# Patient Record
Sex: Female | Born: 1975 | Race: White | Hispanic: No | Marital: Married | Smoking: Never smoker
Health system: Southern US, Community
[De-identification: ages and names within clinical notes are randomized; demographics above are authoritative.]

## PROBLEM LIST (undated history)

## (undated) HISTORY — PX: CHOLECYSTECTOMY: SHX55

## (undated) HISTORY — PX: OTHER SURGICAL HISTORY: SHX169

---

## 2021-05-27 ENCOUNTER — Encounter: Payer: Self-pay | Admitting: Emergency Medicine

## 2021-05-27 ENCOUNTER — Emergency Department: Payer: Managed Care, Other (non HMO)

## 2021-05-27 ENCOUNTER — Emergency Department
Admission: EM | Admit: 2021-05-27 | Discharge: 2021-05-27 | Disposition: A | Discharge status: Home / Self Care | Payer: Managed Care, Other (non HMO) | Attending: Emergency Medicine | Admitting: Emergency Medicine

## 2021-05-27 DIAGNOSIS — R102 Pelvic and perineal pain: Secondary | ICD-10-CM | POA: Diagnosis not present

## 2021-05-27 DIAGNOSIS — M25562 Pain in left knee: Secondary | ICD-10-CM | POA: Insufficient documentation

## 2021-05-27 LAB — CBC
HCT: 41.5 % (ref 36.0–46.0)
Hemoglobin: 14.1 g/dL (ref 12.0–15.0)
MCH: 28.1 pg (ref 26.0–34.0)
MCHC: 34 g/dL (ref 30.0–36.0)
MCV: 82.8 fL (ref 80.0–100.0)
Platelets: 202 10*3/uL (ref 150–400)
RBC: 5.01 MIL/uL (ref 3.87–5.11)
RDW: 14.2 % (ref 11.5–15.5)
WBC: 6.6 10*3/uL (ref 4.0–10.5)
nRBC: 0 % (ref 0.0–0.2)

## 2021-05-27 LAB — URINALYSIS, COMPLETE (UACMP) WITH MICROSCOPIC
Bacteria, UA: NONE SEEN
Bilirubin Urine: NEGATIVE
Glucose, UA: NEGATIVE mg/dL
Ketones, ur: NEGATIVE mg/dL
Leukocytes,Ua: NEGATIVE
Nitrite: NEGATIVE
Protein, ur: NEGATIVE mg/dL
Specific Gravity, Urine: 1.021 (ref 1.005–1.030)
pH: 5 (ref 5.0–8.0)

## 2021-05-27 LAB — COMPREHENSIVE METABOLIC PANEL
ALT: 22 U/L (ref 0–44)
AST: 23 U/L (ref 15–41)
Albumin: 3.8 g/dL (ref 3.5–5.0)
Alkaline Phosphatase: 56 U/L (ref 38–126)
Anion gap: 10 (ref 5–15)
BUN: 15 mg/dL (ref 6–20)
CO2: 24 mmol/L (ref 22–32)
Calcium: 9.1 mg/dL (ref 8.9–10.3)
Chloride: 101 mmol/L (ref 98–111)
Creatinine, Ser: 0.69 mg/dL (ref 0.44–1.00)
GFR, Estimated: >60 mL/min (ref >60)
Glucose, Bld: 120 mg/dL — ABNORMAL HIGH (ref 70–99)
Potassium: 3.7 mmol/L (ref 3.5–5.1)
Sodium: 135 mmol/L (ref 135–145)
Total Bilirubin: 1.2 mg/dL (ref 0.3–1.2)
Total Protein: 7.1 g/dL (ref 6.5–8.1)

## 2021-05-27 LAB — LIPASE, BLOOD: Lipase: 32 U/L (ref 11–51)

## 2021-05-27 LAB — POC URINE PREG, ED: Preg Test, Ur: NEGATIVE

## 2021-05-27 MED ORDER — DICYCLOMINE HCL 10 MG PO CAPS
10.0000 mg | ORAL_CAPSULE | Freq: Once | ORAL | Status: AC
  Administered 2021-05-27: 10 mg via ORAL
  Filled 2021-05-27: qty 1

## 2021-05-27 MED ORDER — IOHEXOL 350 MG/ML SOLN
100.0000 mL | Freq: Once | INTRAVENOUS | Status: AC | PRN
  Administered 2021-05-27: 100 mL via INTRAVENOUS
  Filled 2021-05-27: qty 100

## 2021-05-27 MED ORDER — MORPHINE SULFATE (PF) 4 MG/ML IV SOLN
4.0000 mg | Freq: Once | INTRAVENOUS | Status: AC
  Administered 2021-05-27: 4 mg via INTRAVENOUS
  Filled 2021-05-27: qty 1

## 2021-05-27 MED ORDER — ONDANSETRON HCL 4 MG/2ML IJ SOLN
4.0000 mg | Freq: Once | INTRAMUSCULAR | Status: AC
  Administered 2021-05-27: 4 mg via INTRAVENOUS
  Filled 2021-05-27: qty 2

## 2021-05-27 MED ORDER — OXYCODONE-ACETAMINOPHEN 5-325 MG PO TABS: 1.0000 | ORAL_TABLET | ORAL | 0 refills | Status: AC | PRN

## 2021-05-27 MED ORDER — HYDROMORPHONE HCL 1 MG/ML IJ SOLN
1.0000 mg | Freq: Once | INTRAMUSCULAR | Status: AC
  Administered 2021-05-27: 1 mg via INTRAVENOUS
  Filled 2021-05-27: qty 1

## 2021-05-27 NOTE — ED Notes (Signed)
Pt currently in ultrasound

## 2021-05-27 NOTE — ED Notes (Signed)
Patient provided discharge instructions, including follow up care, ortho follow up, medications to be picked up, and use of knee immobilizer. Patient verbalized understanding. Patient denies questions or concerns. Patient ambulatory with knee immobilizer at discharge.

## 2021-05-27 NOTE — ED Provider Notes (Signed)
[facility] Emergency Department Provider Note  ____________________________________________   Event Date/Time   First MD Initiated Contact with Patient 05/27/21 1222     (approximate)  I have reviewed the triage vital signs and the nursing notes.   HISTORY  Chief Complaint Abdominal Pain    HPI Gloria Summers is a 45 y.o. female presents emergency department with lower abdominal pain.  Patient states she been very bloated and very painful for the last 2 hours.  She denies any vomiting or diarrhea.  Patient does still have an appendix.  History of ovarian cyst.  She denies any fever or chills.  No vaginal discharge.  Patient also has left knee pain in which she was headed [facility] but had to cancel if she is here.  History reviewed. No pertinent past medical history.  There are no problems to display for this patient.     Prior to Admission medications   Medication Sig Start Date End Date Taking? Authorizing Provider  oxyCODONE-acetaminophen (PERCOCET) 5-325 MG tablet Take 1 tablet by mouth every 4 (four) hours as needed for severe pain. 05/27/21 05/27/22 Yes Sheena Donegan, Roselyn Bering, PA-C    Allergies Demerol [meperidine hcl], Doxycycline, and Penicillins  No family history on file.  Social History    Review of Systems  Constitutional: No fever/chills Eyes: No visual changes. ENT: No sore throat. Respiratory: Denies cough Cardiovascular: Denies chest pain Gastrointestinal: Positive abdominal pain Genitourinary: Negative for dysuria. Musculoskeletal: Negative for back pain. Skin: Negative for rash. Psychiatric: no mood changes,     ____________________________________________   PHYSICAL EXAM:  VITAL SIGNS: ED Triage Vitals  Enc Vitals Group     BP 05/27/21 1019 131/88     Pulse Rate 05/27/21 1019 78     Resp 05/27/21 1019 18     Temp 05/27/21 1019 97.9 F (36.6 C)     Temp src --      SpO2 05/27/21 1019 99  %     Weight 05/27/21 1003 200 lb (90.7 kg)     Height 05/27/21 1003 5\' 5"  (1.651 m)     Head Circumference --      Peak Flow --      Pain Score 05/27/21 1003 8     Pain Loc --      Pain Edu? --      Excl. in GC? --     Constitutional: Alert and oriented. Well appearing and in no acute distress. Eyes: Conjunctivae are normal.  Head: Atraumatic. Nose: No congestion/rhinnorhea. Mouth/Throat: Mucous membranes are moist.   Neck:  supple no lymphadenopathy noted Cardiovascular: Normal rate, regular rhythm. Heart sounds are normal Respiratory: Normal respiratory effort.  No retractions, lungs c t a  Abd: soft tender in the lower abdomen bilaterally bs normal all 4 quad GU: deferred Musculoskeletal: FROM all extremities, warm and well perfused, left knee has a large amount of swelling noted tender at the joint line, neurovascular intact Neurologic:  Normal speech and language.  Skin:  Skin is warm, dry and intact. No rash noted. Psychiatric: Mood and affect are normal. Speech and behavior are normal.  ____________________________________________   LABS (all labs ordered are listed, but only abnormal results are displayed)  Labs Reviewed  COMPREHENSIVE METABOLIC PANEL - Abnormal; Notable for the following components:      Result Value   Glucose, Bld 120 (*)    All other components within normal limits  URINALYSIS, COMPLETE (UACMP) WITH MICROSCOPIC - Abnormal; Notable for  the following components:   Color, Urine YELLOW (*)    APPearance HAZY (*)    Hgb urine dipstick SMALL (*)    All other components within normal limits  LIPASE, BLOOD  CBC  POC URINE PREG, ED   ____________________________________________   ____________________________________________  RADIOLOGY  X-ray of the left knee, CT abdomen/pelvis with contrast, ultrasound of the pelvis  ____________________________________________   PROCEDURES  Procedure(s) performed:  No  Procedures    ____________________________________________   INITIAL IMPRESSION / ASSESSMENT AND PLAN / ED COURSE  Pertinent labs & imaging results that were available during my care of the patient were reviewed by me and considered in my medical decision making (see chart for details).   The patient is a 45 year old female presents with lower abdominal pain.  See HPI.  Physical exam shows patient per stable.  DDx: Acute appendicitis, bowel obstruction, ovarian cyst, acute cholecystitis  Labs are reassuring, CBC, metabolic panel, lipase are all normal, POC pregnancy is negative, urinalysis is normal  CT of the abdomen/pelvis with IV contrast does not show any acute abnormality, reviewed by me confirmed by radiology  X-ray of the left knee reviewed by me confirmed by radiology to be negative for any acute abnormality  Ultrasound of the pelvis complete shows a luteal cyst.  Explained the findings to the patient.  She was given 2 rounds of pain medication.  At this time patient states she still feels uncomfortable but not as severe as earlier.  Explained to her she should follow-up with her regular doctor or return emergency department for worsening.  She is given a prescription for pain medication.  Discharged in the care of her husband.  She was in stable condition.     Gloria Summers was evaluated in Emergency Department on 05/27/2021 for the symptoms described in the history of present illness. She was evaluated in the context of the global COVID-19 pandemic, which necessitated consideration that the patient might be at risk for infection with the SARS-CoV-2 virus that causes COVID-19. Institutional protocols and algorithms that pertain to the evaluation of patients at risk for COVID-19 are in a state of rapid change based on information released by regulatory bodies including the CDC and federal and state organizations. These policies and algorithms were followed during the patient's  care in the ED.    As part of my medical decision making, I reviewed the following data within the electronic MEDICAL RECORD NUMBER History obtained from family, Nursing notes reviewed and incorporated, Labs reviewed , Old chart reviewed, Radiograph reviewed , Notes from prior ED visits, and [place] Controlled Substance Database  ____________________________________________   FINAL CLINICAL IMPRESSION(S) / ED DIAGNOSES  Final diagnoses:  Pelvic pain  Acute pain of left knee      NEW MEDICATIONS STARTED DURING THIS VISIT:  Discharge Medication List as of 05/27/2021  3:09 PM     START taking these medications   Details  oxyCODONE-acetaminophen (PERCOCET) 5-325 MG tablet Take 1 tablet by mouth every 4 (four) hours as needed for severe pain., Starting Wed 05/27/2021, Until Thu 05/27/2022 at 2359, Normal         Note:  This document was prepared using Dragon voice recognition software and may include unintentional dictation errors.    Faythe Ghee, PA-C 05/27/21 1721    Arnaldo Natal, MD 05/27/21 [phone]

## 2021-05-27 NOTE — ED Triage Notes (Signed)
Lower abdominal pain x 2 hours.

## 2021-05-27 NOTE — ED Notes (Signed)
Pt returned from ultrasound

## 2021-09-02 IMAGING — DX DG KNEE COMPLETE 4+V*L*
4 series · 5 of 5 positions shown · non-contrast
Comparison: None.

CLINICAL DATA: Injury with knee pain

EXAM:
LEFT KNEE - COMPLETE 4+ VIEW

[knee ap]
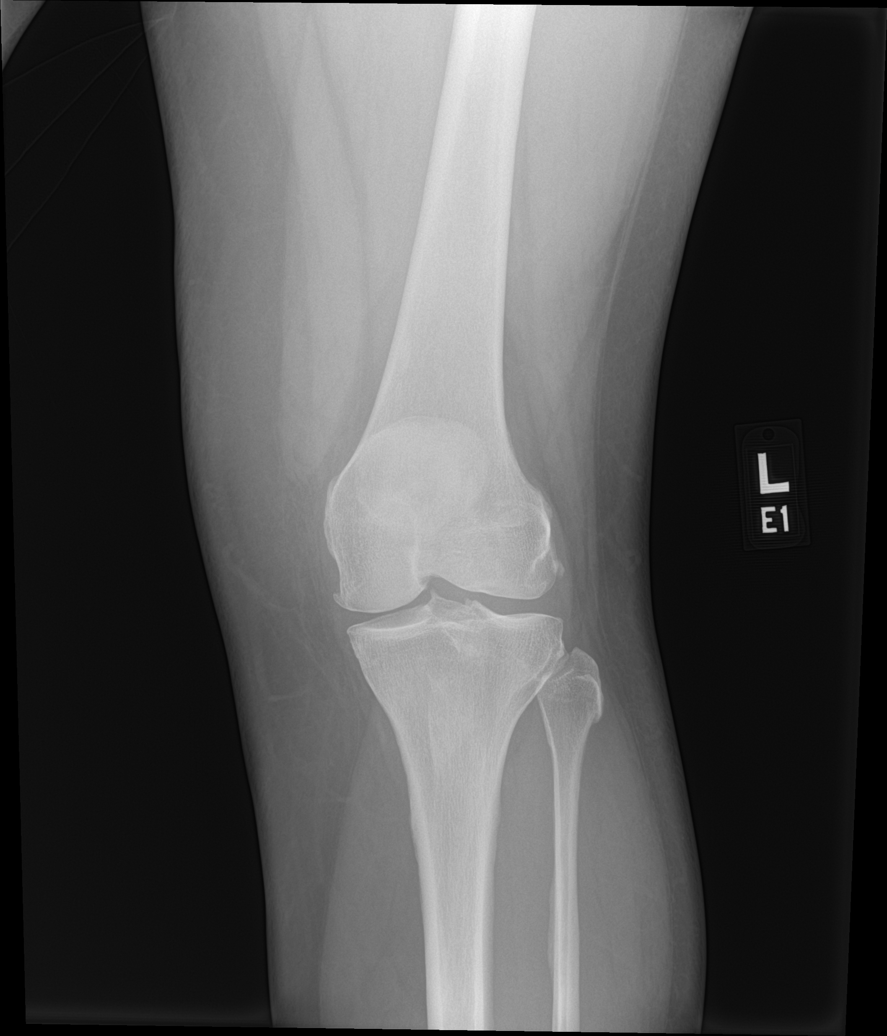

[knee obl (1 of 2)]
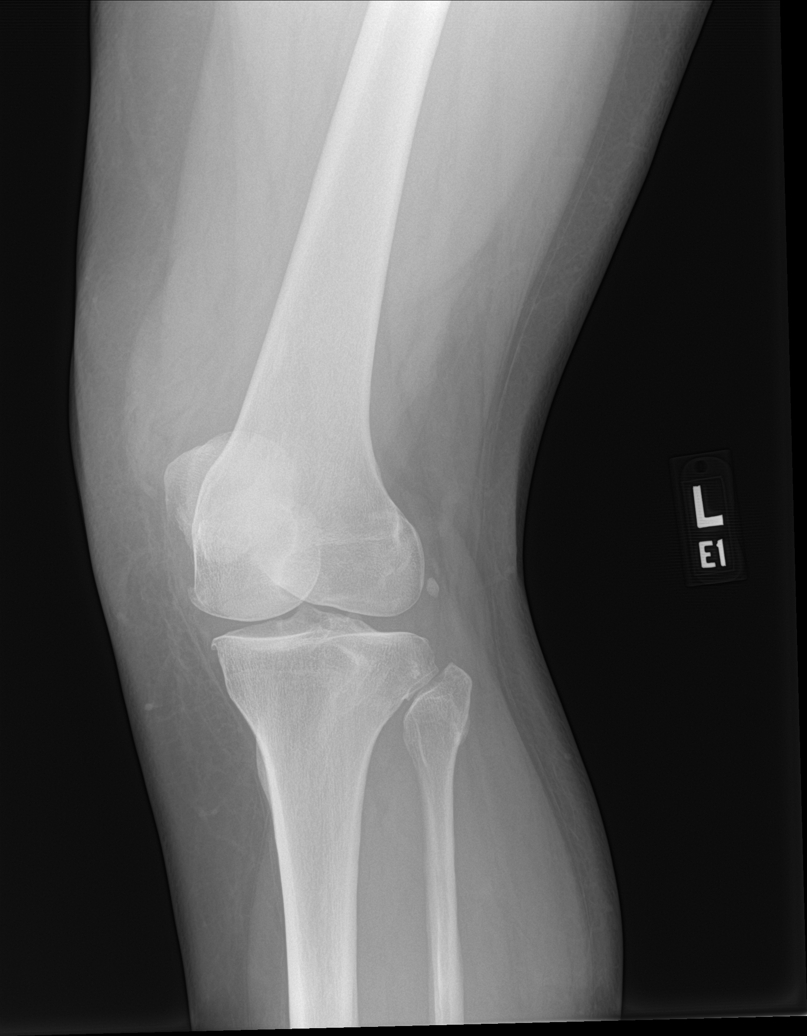

[Series 3: knee lat · 0.14mm/px · 2 of 2 slices shown]
[im 1/2]
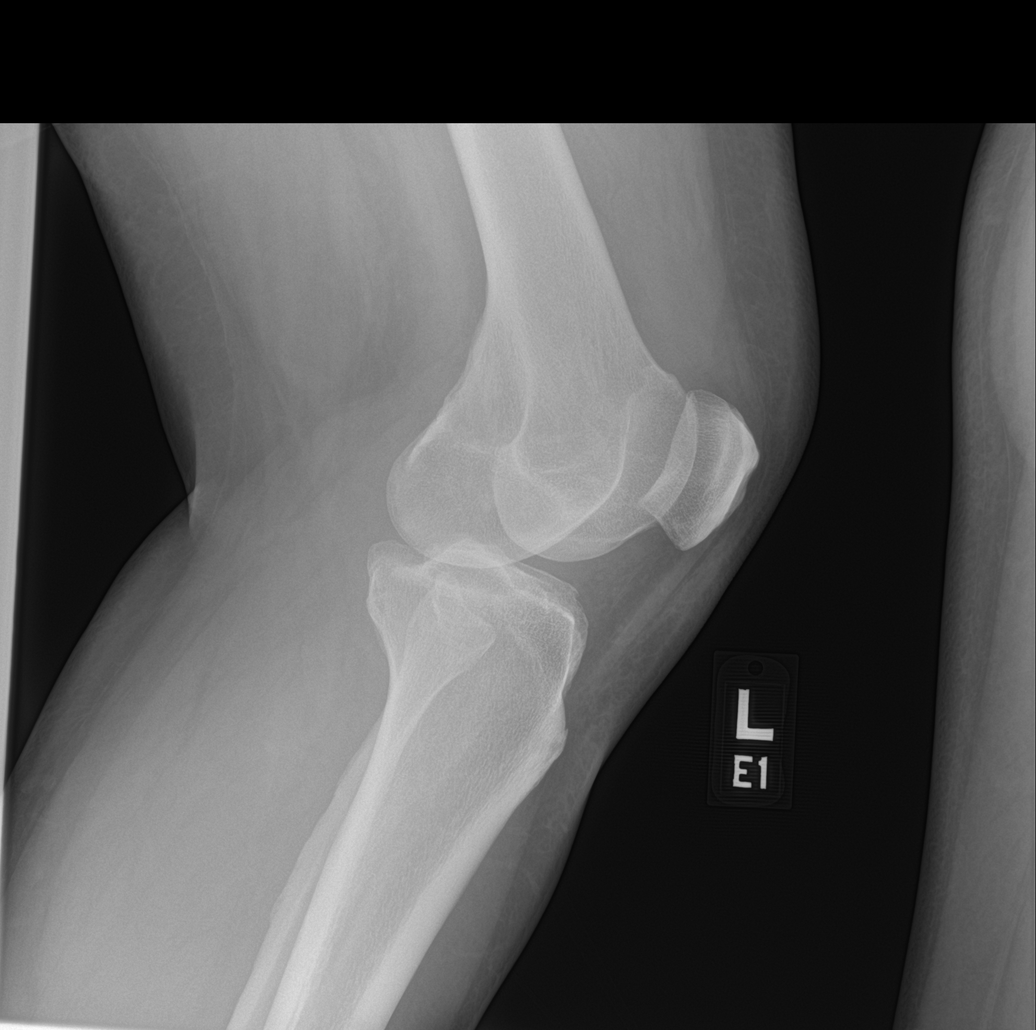
[im 2/2]
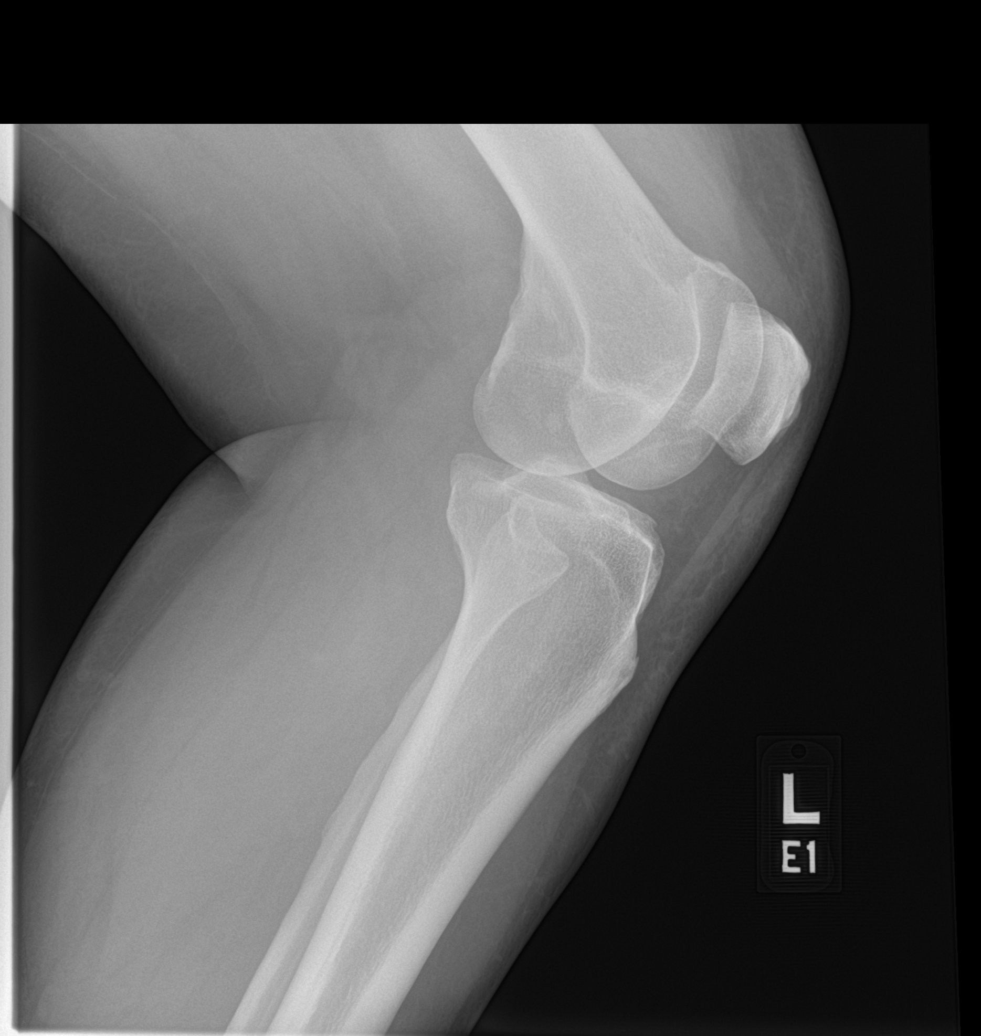

[knee obl (2 of 2)]
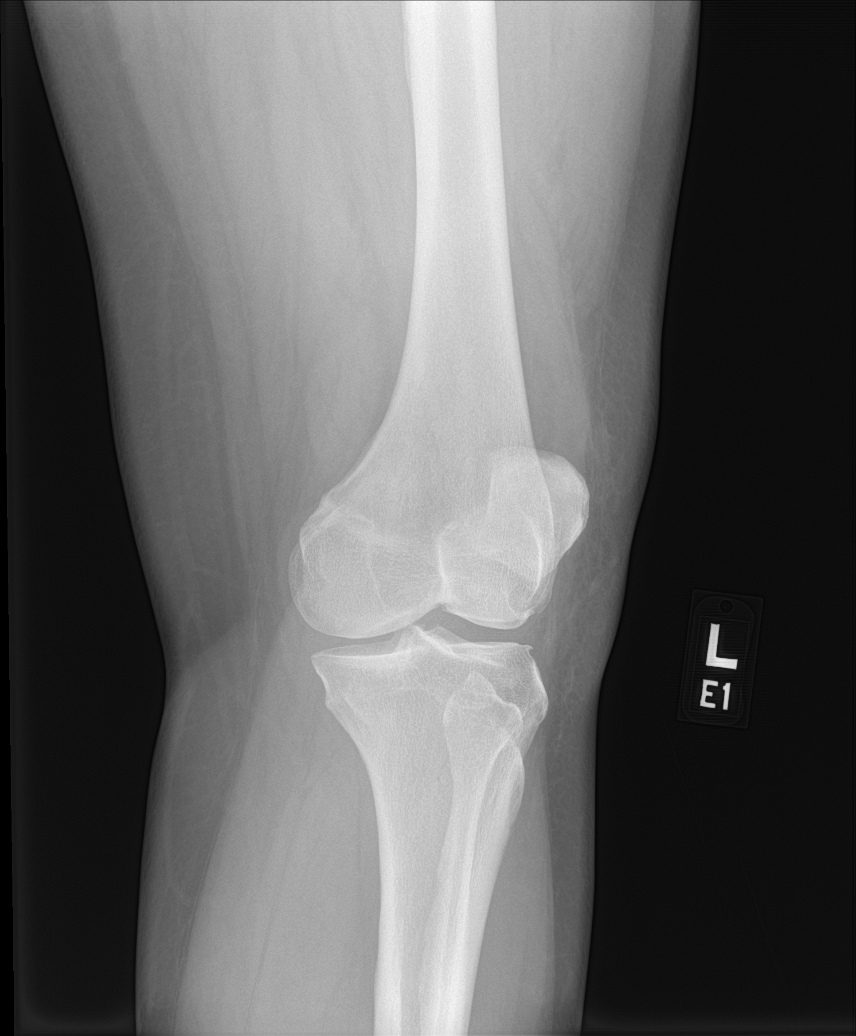

[5 of 5 positions shown; findings below may reference images not displayed]

FINDINGS: No visible joint effusion. No fracture. No focal lesion. Small
medial compartment marginal osteophytes.
IMPRESSION: No acute or traumatic finding. Small medial compartment osteophytes.

## 2022-11-15 DIAGNOSIS — N200 Calculus of kidney: Secondary | ICD-10-CM

## 2022-11-15 HISTORY — DX: Calculus of kidney: N20.0

## 2023-05-22 ENCOUNTER — Emergency Department: Payer: 59

## 2023-05-22 ENCOUNTER — Other Ambulatory Visit: Payer: Self-pay

## 2023-05-22 ENCOUNTER — Emergency Department
Admission: EM | Admit: 2023-05-22 | Discharge: 2023-05-22 | Disposition: A | Discharge status: Home / Self Care | Payer: 59 | Attending: Emergency Medicine | Admitting: Emergency Medicine

## 2023-05-22 DIAGNOSIS — R0789 Other chest pain: Secondary | ICD-10-CM | POA: Diagnosis not present

## 2023-05-22 DIAGNOSIS — R079 Chest pain, unspecified: Secondary | ICD-10-CM | POA: Diagnosis present

## 2023-05-22 LAB — BASIC METABOLIC PANEL
Anion gap: 7 (ref 5–15)
BUN: 14 mg/dL (ref 6–20)
CO2: 26 mmol/L (ref 22–32)
Calcium: 8.6 mg/dL — ABNORMAL LOW (ref 8.9–10.3)
Chloride: 105 mmol/L (ref 98–111)
Creatinine, Ser: 0.65 mg/dL (ref 0.44–1.00)
GFR, Estimated: >60 mL/min (ref >60)
Glucose, Bld: 100 mg/dL — ABNORMAL HIGH (ref 70–99)
Potassium: 3.3 mmol/L — ABNORMAL LOW (ref 3.5–5.1)
Sodium: 138 mmol/L (ref 135–145)

## 2023-05-22 LAB — CBC
HCT: 40.5 % (ref 36.0–46.0)
Hemoglobin: 13.3 g/dL (ref 12.0–15.0)
MCH: 28.2 pg (ref 26.0–34.0)
MCHC: 32.8 g/dL (ref 30.0–36.0)
MCV: 86 fL (ref 80.0–100.0)
Platelets: 201 10*3/uL (ref 150–400)
RBC: 4.71 MIL/uL (ref 3.87–5.11)
RDW: 13.4 % (ref 11.5–15.5)
WBC: 7.3 10*3/uL (ref 4.0–10.5)
nRBC: 0 % (ref 0.0–0.2)

## 2023-05-22 LAB — TROPONIN I (HIGH SENSITIVITY)
Troponin I (High Sensitivity): 4 ng/L (ref <18)
Troponin I (High Sensitivity): <2 ng/L (ref <18)

## 2023-05-22 LAB — D-DIMER, QUANTITATIVE: D-Dimer, Quant: <0.27 ug/mL-FEU (ref 0.00–0.50)

## 2023-05-22 MED ORDER — IBUPROFEN 600 MG PO TABS
600.0000 mg | ORAL_TABLET | Freq: Once | ORAL | Status: AC
  Administered 2023-05-22: 600 mg via ORAL
  Filled 2023-05-22: qty 1

## 2023-05-22 NOTE — Discharge Instructions (Addendum)
Return to the ER for new, worsening, or persistent severe chest pain, difficulty breathing, weakness or lightheadedness, fever, or any other new or worsening symptoms that concern you.

## 2023-05-22 NOTE — ED Notes (Signed)
See triage note  Presents with some chest tightness  States that started yesterday  But now having discomfort into back Denies any n/v/ cough or diaphoresis

## 2023-05-22 NOTE — ED Triage Notes (Signed)
Pt to ED for mid chest pressure with radiation to back since yesterday, worse today. Endorses SOB with exertion and mild dizziness since yesterday. Both arms tingling, L arm intermittently numb since. Everything started yesterday.  Skin dry. Respirations appear unlabored.

## 2023-05-22 NOTE — ED Provider Notes (Signed)
[facility] Provider Note    Event Date/Time   First MD Initiated Contact with Patient 05/22/23 1229     (approximate)   History   Chest Pain   HPI  Gloria Summers is a 47 y.o. female with a history of depression and ovarian cyst who presents with chest pain, acute onset yesterday, substernal in location, somewhat radiating to the back, described as a squeezing sensation, waxing and waning in intensity but not resolving.  The patient reports some associated shortness of breath as well as tingling in the arms especially on the left side since that time.  She denies any nausea or vomiting.  She has no fever or chills.  She states she feels mildly lightheaded although also believes this may be due to recent vertigo.  She has no leg pain or swelling.  The patient denies any prior history of similar pain.  She was evaluated for chest pain a few years ago but states that she believes this was due to a panic attack.  She denies any acute anxiety at this time.    I the past medical records.  The patient was seen at an ED in [place] in 2021 with chest pain and had a negative workup at that time.  Physically she was most recently seen in the ED here in 2022 with lower abdominal pain.   Physical Exam   Triage Vital Signs: ED Triage Vitals  Enc Vitals Group     BP 05/22/23 1150 132/73     Pulse Rate 05/22/23 1150 77     Resp 05/22/23 1150 16     Temp 05/22/23 1150 98 F (36.7 C)     Temp Source 05/22/23 1150 Oral     SpO2 05/22/23 1150 96 %     Weight 05/22/23 1146 170 lb (77.1 kg)     Height 05/22/23 1146 5\' 3"  (1.6 m)     Head Circumference --      Peak Flow --      Pain Score 05/22/23 1146 7     Pain Loc --      Pain Edu? --      Excl. in GC? --     Most recent vital signs: Vitals:   05/22/23 1150  BP: 132/73  Pulse: 77  Resp: 16  Temp: 98 F (36.7 C)  SpO2: 96%     General: Awake, no distress.  CV:  Good peripheral perfusion.  Normal heart  sounds. Resp:  Normal effort.  Lungs CTAB. Abd:  No distention.  Other:  No peripheral edema.  No calf tenderness.  5/5 motor strength and intact sensation to bilateral upper extremities.    ED Results / Procedures / Treatments   Labs (all labs ordered are listed, but only abnormal results are displayed) Labs Reviewed  BASIC METABOLIC PANEL - Abnormal; Notable for the following components:      Result Value   Potassium 3.3 (*)    Glucose, Bld 100 (*)    Calcium 8.6 (*)    All other components within normal limits  CBC  D-DIMER, QUANTITATIVE  POC URINE PREG, ED  TROPONIN I (HIGH SENSITIVITY)  TROPONIN I (HIGH SENSITIVITY)     EKG  ED ECG REPORT I, Dionne Bucy, the attending physician, personally viewed and interpreted this ECG.  Date: 05/22/2023 EKG Time: 1140 Rate: 81 Rhythm: normal sinus rhythm with PVCs QRS Axis: normal Intervals: normal ST/T Wave abnormalities: normal Narrative Interpretation: no evidence of acute ischemia  RADIOLOGY  Chest x-ray: I independently viewed and interpreted the images; there is no focal consolidation or edema.  No widened mediastinum.   PROCEDURES:  Critical Care performed: No  Procedures   MEDICATIONS ORDERED IN ED: Medications  ibuprofen (ADVIL) tablet 600 mg (has no administration in time range)     IMPRESSION / MDM / ASSESSMENT AND PLAN / ED COURSE  I reviewed the triage vital signs and the nursing notes.  47 year old female with PMH as noted above presents with atypical chest pain since yesterday with some paresthesias in her arms and shortness of breath.  On exam the patient is overall well-appearing.  Her vital signs are normal.  Exam is otherwise unremarkable for acute findings.  EKG is nonischemic.  Chest x-ray does not show any acute abnormality.  Differential diagnosis includes, but is not limited to, musculoskeletal chest pain, GERD, other benign etiology, less likely ACS.  The patient has no  cardiac risk factors.  I have a low suspicion for PE.  There is also no clinical evidence for aortic dissection or other vascular cause; although the patient does report radiation to the back, she is comfortable appearing, has no hypertension or tachycardia, and no risk factors.  Patient's presentation is most consistent with acute presentation with potential threat to life or bodily function.  We will obtain repeat troponin, D-dimer, and reassess.  ----------------------------------------- 2:52 PM on 05/22/2023 -----------------------------------------  Repeat troponin and D-dimer are both negative.  The patient has minimal discomfort at this time.  She appears well.  I counseled her on the results of the workup.  At this time there is no indication for further ED workup or observation.  The patient feels comfortable and would like to go home.  I gave her strict return precautions and she expressed understanding.  She will follow-up with her PMD.    FINAL CLINICAL IMPRESSION(S) / ED DIAGNOSES   Final diagnoses:  Atypical chest pain     Rx / DC Orders   ED Discharge Orders     None        Note:  This document was prepared using Dragon voice recognition software and may include unintentional dictation errors.    Dionne Bucy, MD 05/22/23 1452
